# Patient Record
Sex: Male | Born: 2000 | Race: Black or African American | Hispanic: No | Marital: Single | Smoking: Current every day smoker
Health system: Southern US, Community
[De-identification: ages and names within clinical notes are randomized; demographics above are authoritative.]

## PROBLEM LIST (undated history)

## (undated) DIAGNOSIS — F909 Attention-deficit hyperactivity disorder, unspecified type: Secondary | ICD-10-CM

## (undated) DIAGNOSIS — S31119A Laceration without foreign body of abdominal wall, unspecified quadrant without penetration into peritoneal cavity, initial encounter: Secondary | ICD-10-CM

---
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
## 2020-04-16 ENCOUNTER — Ambulatory Visit: Payer: Medicaid Other | Attending: Student

## 2020-04-16 ENCOUNTER — Other Ambulatory Visit: Payer: Self-pay

## 2020-04-16 DIAGNOSIS — M6281 Muscle weakness (generalized): Secondary | ICD-10-CM

## 2020-04-16 DIAGNOSIS — R2689 Other abnormalities of gait and mobility: Secondary | ICD-10-CM

## 2020-04-16 DIAGNOSIS — M25571 Pain in right ankle and joints of right foot: Secondary | ICD-10-CM | POA: Diagnosis not present

## 2020-04-16 DIAGNOSIS — M25671 Stiffness of right ankle, not elsewhere classified: Secondary | ICD-10-CM

## 2020-04-16 NOTE — Patient Instructions (Signed)
Ankle  DF stretch , AROM toes and ankle 4 way   3x/day 3-5 reps 30 sec hold.
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
## 2020-05-07 ENCOUNTER — Ambulatory Visit: Payer: Medicaid Other | Admitting: Physical Therapy

## 2020-05-08 ENCOUNTER — Telehealth: Payer: Self-pay | Admitting: Physical Therapy

## 2020-05-08 ENCOUNTER — Ambulatory Visit: Payer: Medicaid Other | Attending: Student | Admitting: Physical Therapy

## 2020-05-08 NOTE — Telephone Encounter (Signed)
Attempted to call patient and left voicemail regarding no show for 2:15 therapy appointment. Left reminder of next scheduled appointment as well as therapy attendance policy and requested to call clinic back if needing to cancel/reschedule visit.

## 2020-05-12 ENCOUNTER — Telehealth: Payer: Self-pay | Admitting: Physical Therapy

## 2020-05-12 ENCOUNTER — Ambulatory Visit: Payer: Medicaid Other | Admitting: Physical Therapy

## 2020-05-12 NOTE — Telephone Encounter (Signed)
Spoke with patient's mom regarding his missed appointment today. I stated it is his 2nd missed appointment which we can only schedule 1 appointment at a time per clinic policy. I noted when his next scheduled appointment is and if he cannot make it to call before his appointment and we can work to reschedule that for him. If he ends up missing this appointment that would be 3 consecutive missed appointments which would be grounds for discharge. Patient's mom stated she will let him know and he will attend the next session.   Sharetha Newson PT, DPT, LAT, ATC  05/12/20  4:24 PM

## 2020-05-14 ENCOUNTER — Ambulatory Visit: Payer: Medicaid Other | Admitting: Rehabilitative and Restorative Service Providers"

## 2020-05-19 ENCOUNTER — Ambulatory Visit: Payer: Medicaid Other | Admitting: Physical Therapy

## 2020-05-22 ENCOUNTER — Ambulatory Visit: Payer: Medicaid Other | Admitting: Physical Therapy

## 2020-12-12 ENCOUNTER — Other Ambulatory Visit: Payer: Self-pay | Admitting: Orthopaedic Surgery

## 2020-12-12 DIAGNOSIS — M25571 Pain in right ankle and joints of right foot: Secondary | ICD-10-CM

## 2020-12-26 ENCOUNTER — Other Ambulatory Visit: Payer: Self-pay | Admitting: Orthopaedic Surgery

## 2020-12-26 DIAGNOSIS — M25571 Pain in right ankle and joints of right foot: Secondary | ICD-10-CM

## 2020-12-30 ENCOUNTER — Encounter: Payer: Self-pay | Admitting: Neurology

## 2021-01-05 ENCOUNTER — Other Ambulatory Visit: Payer: Medicaid Other

## 2021-01-05 ENCOUNTER — Other Ambulatory Visit: Payer: Self-pay

## 2021-01-05 ENCOUNTER — Ambulatory Visit
Admission: RE | Admit: 2021-01-05 | Discharge: 2021-01-05 | Disposition: A | Discharge status: Home / Self Care | Payer: Medicaid Other | Source: Ambulatory Visit | Attending: Orthopaedic Surgery | Admitting: Orthopaedic Surgery

## 2021-01-05 DIAGNOSIS — M25571 Pain in right ankle and joints of right foot: Secondary | ICD-10-CM

## 2021-01-20 ENCOUNTER — Other Ambulatory Visit: Payer: Self-pay

## 2021-01-20 DIAGNOSIS — R202 Paresthesia of skin: Secondary | ICD-10-CM

## 2021-01-21 ENCOUNTER — Other Ambulatory Visit: Payer: Self-pay

## 2021-01-21 ENCOUNTER — Ambulatory Visit (INDEPENDENT_AMBULATORY_CARE_PROVIDER_SITE_OTHER): Payer: Medicaid Other | Admitting: Neurology

## 2021-01-21 DIAGNOSIS — R202 Paresthesia of skin: Secondary | ICD-10-CM | POA: Diagnosis not present

## 2021-01-21 DIAGNOSIS — R29898 Other symptoms and signs involving the musculoskeletal system: Secondary | ICD-10-CM
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
## 2021-05-27 ENCOUNTER — Encounter (HOSPITAL_COMMUNITY): Payer: Self-pay | Admitting: Emergency Medicine

## 2021-05-27 ENCOUNTER — Emergency Department (HOSPITAL_COMMUNITY): Payer: Medicaid Other

## 2021-05-27 ENCOUNTER — Observation Stay (HOSPITAL_COMMUNITY)
Admission: EM | Admit: 2021-05-27 | Discharge: 2021-05-28 | Disposition: A | Discharge status: Home / Self Care | Payer: Medicaid Other | Attending: General Surgery | Admitting: General Surgery

## 2021-05-27 ENCOUNTER — Other Ambulatory Visit: Payer: Self-pay

## 2021-05-27 DIAGNOSIS — Y92009 Unspecified place in unspecified non-institutional (private) residence as the place of occurrence of the external cause: Secondary | ICD-10-CM | POA: Insufficient documentation

## 2021-05-27 DIAGNOSIS — S31610A Laceration without foreign body of abdominal wall, right upper quadrant with penetration into peritoneal cavity, initial encounter: Principal | ICD-10-CM | POA: Insufficient documentation

## 2021-05-27 DIAGNOSIS — J9690 Respiratory failure, unspecified, unspecified whether with hypoxia or hypercapnia: Secondary | ICD-10-CM

## 2021-05-27 DIAGNOSIS — Z23 Encounter for immunization: Secondary | ICD-10-CM | POA: Insufficient documentation

## 2021-05-27 DIAGNOSIS — S21111A Laceration without foreign body of right front wall of thorax without penetration into thoracic cavity, initial encounter: Secondary | ICD-10-CM | POA: Diagnosis not present

## 2021-05-27 DIAGNOSIS — T1490XA Injury, unspecified, initial encounter: Secondary | ICD-10-CM

## 2021-05-27 DIAGNOSIS — J90 Pleural effusion, not elsewhere classified: Secondary | ICD-10-CM | POA: Insufficient documentation

## 2021-05-27 DIAGNOSIS — Z20822 Contact with and (suspected) exposure to covid-19: Secondary | ICD-10-CM | POA: Insufficient documentation

## 2021-05-27 DIAGNOSIS — T148XXA Other injury of unspecified body region, initial encounter: Secondary | ICD-10-CM | POA: Diagnosis present

## 2021-05-27 LAB — PROTIME-INR
INR: 1.1 (ref 0.8–1.2)
INR: 1.1 (ref 0.8–1.2)
Prothrombin Time: 13.7 s (ref 11.4–15.2)
Prothrombin Time: 14.2 s (ref 11.4–15.2)

## 2021-05-27 LAB — SAMPLE TO BLOOD BANK

## 2021-05-27 LAB — RESP PANEL BY RT-PCR (FLU A&B, COVID) ARPGX2
Influenza A by PCR: NEGATIVE
Influenza B by PCR: NEGATIVE
SARS Coronavirus 2 by RT PCR: NEGATIVE

## 2021-05-27 LAB — URINALYSIS, ROUTINE W REFLEX MICROSCOPIC
Bacteria, UA: NONE SEEN
Bilirubin Urine: NEGATIVE
Glucose, UA: NEGATIVE mg/dL
Hgb urine dipstick: NEGATIVE
Ketones, ur: NEGATIVE mg/dL
Leukocytes,Ua: NEGATIVE
Nitrite: NEGATIVE
Protein, ur: 30 mg/dL — AB
Specific Gravity, Urine: 1.033 — ABNORMAL HIGH (ref 1.005–1.030)
pH: 6 (ref 5.0–8.0)

## 2021-05-27 LAB — I-STAT CHEM 8, ED
BUN: 15 mg/dL (ref 6–20)
Calcium, Ion: 1.09 mmol/L — ABNORMAL LOW (ref 1.15–1.40)
Chloride: 104 mmol/L (ref 98–111)
Creatinine, Ser: 1.2 mg/dL (ref 0.61–1.24)
Glucose, Bld: 99 mg/dL (ref 70–99)
HCT: 48 % (ref 39.0–52.0)
Hemoglobin: 16.3 g/dL (ref 13.0–17.0)
Potassium: 3.9 mmol/L (ref 3.5–5.1)
Sodium: 142 mmol/L (ref 135–145)
TCO2: 27 mmol/L (ref 22–32)

## 2021-05-27 LAB — COMPREHENSIVE METABOLIC PANEL
ALT: 11 U/L (ref 0–44)
AST: 28 U/L (ref 15–41)
Albumin: 4.4 g/dL (ref 3.5–5.0)
Alkaline Phosphatase: 68 U/L (ref 38–126)
Anion gap: 11 (ref 5–15)
BUN: 13 mg/dL (ref 6–20)
CO2: 24 mmol/L (ref 22–32)
Calcium: 9.4 mg/dL (ref 8.9–10.3)
Chloride: 106 mmol/L (ref 98–111)
Creatinine, Ser: 1.29 mg/dL — ABNORMAL HIGH (ref 0.61–1.24)
GFR, Estimated: >60 mL/min (ref >60)
Glucose, Bld: 98 mg/dL (ref 70–99)
Potassium: 4.1 mmol/L (ref 3.5–5.1)
Sodium: 141 mmol/L (ref 135–145)
Total Bilirubin: 1.7 mg/dL — ABNORMAL HIGH (ref 0.3–1.2)
Total Protein: 7 g/dL (ref 6.5–8.1)

## 2021-05-27 LAB — CBC
HCT: 48.2 % (ref 39.0–52.0)
Hemoglobin: 16.8 g/dL (ref 13.0–17.0)
MCH: 29.1 pg (ref 26.0–34.0)
MCHC: 34.9 g/dL (ref 30.0–36.0)
MCV: 83.4 fL (ref 80.0–100.0)
Platelets: 336 10*3/uL (ref 150–400)
RBC: 5.78 MIL/uL (ref 4.22–5.81)
RDW: 12.2 % (ref 11.5–15.5)
WBC: 5.2 10*3/uL (ref 4.0–10.5)
nRBC: 0 % (ref 0.0–0.2)

## 2021-05-27 LAB — ETHANOL: Alcohol, Ethyl (B): <10 mg/dL (ref <10)

## 2021-05-27 LAB — LACTIC ACID, PLASMA: Lactic Acid, Venous: 2.1 mmol/L (ref 0.5–1.9)

## 2021-05-27 LAB — HIV ANTIBODY (ROUTINE TESTING W REFLEX): HIV Screen 4th Generation wRfx: NONREACTIVE

## 2021-05-27 MED ORDER — SODIUM CHLORIDE 0.9 % IV SOLN
INTRAVENOUS | Status: AC | PRN
  Administered 2021-05-27: 1000 mL via INTRAVENOUS

## 2021-05-27 MED ORDER — ONDANSETRON 4 MG PO TBDP
4.0000 mg | ORAL_TABLET | Freq: Four times a day (QID) | ORAL | Status: DC | PRN
  Filled 2021-05-27: qty 1

## 2021-05-27 MED ORDER — CEFAZOLIN SODIUM-DEXTROSE 2-4 GM/100ML-% IV SOLN
2.0000 g | Freq: Once | INTRAVENOUS | Status: AC
  Administered 2021-05-27: 2 g via INTRAVENOUS

## 2021-05-27 MED ORDER — METHOCARBAMOL 500 MG PO TABS
1000.0000 mg | ORAL_TABLET | Freq: Three times a day (TID) | ORAL | Status: DC
  Administered 2021-05-27 – 2021-05-28 (×3): 1000 mg via ORAL
  Filled 2021-05-27 (×3): qty 2

## 2021-05-27 MED ORDER — ONDANSETRON HCL 4 MG/2ML IJ SOLN: 4.0000 mg | Freq: Four times a day (QID) | INTRAMUSCULAR | Status: DC | PRN

## 2021-05-27 MED ORDER — OXYCODONE HCL 5 MG PO TABS
10.0000 mg | ORAL_TABLET | Freq: Once | ORAL | Status: AC
  Administered 2021-05-27: 10 mg via ORAL
  Filled 2021-05-27: qty 2

## 2021-05-27 MED ORDER — IOHEXOL 300 MG/ML  SOLN
100.0000 mL | Freq: Once | INTRAMUSCULAR | Status: AC | PRN
  Administered 2021-05-27: 100 mL via INTRAVENOUS

## 2021-05-27 MED ORDER — ACETAMINOPHEN 500 MG PO TABS
1000.0000 mg | ORAL_TABLET | Freq: Four times a day (QID) | ORAL | Status: DC
  Administered 2021-05-27 – 2021-05-28 (×3): 1000 mg via ORAL
  Filled 2021-05-27 (×3): qty 2

## 2021-05-27 MED ORDER — METHOCARBAMOL 500 MG PO TABS: 1000.0000 mg | ORAL_TABLET | Freq: Three times a day (TID) | ORAL | Status: DC

## 2021-05-27 MED ORDER — LIDOCAINE-EPINEPHRINE (PF) 2 %-1:200000 IJ SOLN
INTRAMUSCULAR | Status: AC
  Filled 2021-05-27: qty 20

## 2021-05-27 MED ORDER — ENOXAPARIN SODIUM 30 MG/0.3ML IJ SOSY
30.0000 mg | PREFILLED_SYRINGE | Freq: Two times a day (BID) | INTRAMUSCULAR | Status: DC
  Filled 2021-05-27: qty 0.3

## 2021-05-27 MED ORDER — TETANUS-DIPHTH-ACELL PERTUSSIS 5-2.5-18.5 LF-MCG/0.5 IM SUSY
0.5000 mL | PREFILLED_SYRINGE | Freq: Once | INTRAMUSCULAR | Status: AC
  Administered 2021-05-27: 0.5 mL via INTRAMUSCULAR

## 2021-05-27 MED ORDER — OXYCODONE HCL 5 MG PO TABS
5.0000 mg | ORAL_TABLET | ORAL | Status: DC | PRN
  Administered 2021-05-27 – 2021-05-28 (×4): 10 mg via ORAL
  Filled 2021-05-27 (×4): qty 2

## 2021-05-27 MED ORDER — FENTANYL CITRATE PF 50 MCG/ML IJ SOSY
PREFILLED_SYRINGE | INTRAMUSCULAR | Status: AC
  Administered 2021-05-27: 50 ug via INTRAVENOUS
  Filled 2021-05-27: qty 1

## 2021-05-27 MED ORDER — DOCUSATE SODIUM 100 MG PO CAPS
100.0000 mg | ORAL_CAPSULE | Freq: Two times a day (BID) | ORAL | Status: DC
  Administered 2021-05-28: 100 mg via ORAL
  Filled 2021-05-27 (×2): qty 1

## 2021-05-27 MED ORDER — LACTATED RINGERS IV SOLN: INTRAVENOUS | Status: AC
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
## 2021-05-27 NOTE — ED Notes (Signed)
Attempted to call report, call back number left.

## 2021-05-27 NOTE — Discharge Planning (Signed)
TOC following for disposition needs. 
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
## 2021-05-27 NOTE — Progress Notes (Signed)
Patient arrived to 6 north room 13 alert and oriented x4. Pain level 5. Call light in reach bed in lowest position. Will continue to monitor pt.
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
## 2021-05-28 ENCOUNTER — Observation Stay (HOSPITAL_COMMUNITY): Payer: Medicaid Other

## 2021-05-28 LAB — BASIC METABOLIC PANEL
Anion gap: 10 (ref 5–15)
BUN: 12 mg/dL (ref 6–20)
CO2: 25 mmol/L (ref 22–32)
Calcium: 8.8 mg/dL — ABNORMAL LOW (ref 8.9–10.3)
Chloride: 103 mmol/L (ref 98–111)
Creatinine, Ser: 1.23 mg/dL (ref 0.61–1.24)
GFR, Estimated: >60 mL/min (ref >60)
Glucose, Bld: 132 mg/dL — ABNORMAL HIGH (ref 70–99)
Potassium: 3.7 mmol/L (ref 3.5–5.1)
Sodium: 138 mmol/L (ref 135–145)

## 2021-05-28 LAB — CBC
HCT: 39.1 % (ref 39.0–52.0)
Hemoglobin: 13.7 g/dL (ref 13.0–17.0)
MCH: 28.8 pg (ref 26.0–34.0)
MCHC: 35 g/dL (ref 30.0–36.0)
MCV: 82.1 fL (ref 80.0–100.0)
Platelets: 288 10*3/uL (ref 150–400)
RBC: 4.76 MIL/uL (ref 4.22–5.81)
RDW: 12.2 % (ref 11.5–15.5)
WBC: 11.5 10*3/uL — ABNORMAL HIGH (ref 4.0–10.5)
nRBC: 0 % (ref 0.0–0.2)

## 2021-05-28 MED ORDER — ACETAMINOPHEN 500 MG PO TABS: 1000.0000 mg | ORAL_TABLET | Freq: Four times a day (QID) | ORAL | 0 refills | Status: AC | PRN

## 2021-05-28 MED ORDER — IBUPROFEN 200 MG PO TABS: 600.0000 mg | ORAL_TABLET | Freq: Three times a day (TID) | ORAL | 2 refills | Status: DC | PRN

## 2021-05-28 MED ORDER — OXYCODONE HCL 5 MG PO TABS: 5.0000 mg | ORAL_TABLET | Freq: Four times a day (QID) | ORAL | 0 refills | Status: DC | PRN
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
## 2021-05-28 NOTE — TOC CAGE-AID Note (Signed)
Transition of Care [facility]) - CAGE-AID Screening   Patient Details  Name: Sean Watson MRN: [id number] Date of Birth: 2000-07-30  Transition of Care [facility]) CM/SW Contact:    Dwon Sky C Tarpley-Carter, LCSWA Phone Number: 05/28/2021, 9:47 AM   Clinical Narrative: Pt participated in Cage-Aid.  Pt stated he does not use substance or ETOH.  Pt was not offered resources, due to no usage of substance or ETOH.    Cylinda Santoli Tarpley-Carter, MSW, LCSW-A Pronouns:  She/Her/Hers [place] Transitions of Care Clinical Social Worker Direct Number:  [phone] [email or web address]  CAGE-AID Screening:    Have You Ever Felt You Ought to Cut Down on Your Drinking or Drug Use?: No Have People Annoyed You By [employer] Your Drinking Or Drug Use?: No Have You Felt Bad Or Guilty About Your Drinking Or Drug Use?: No Have You Ever Had a Drink or Used Drugs First Thing In The Morning to Steady Your Nerves or to Get Rid of a Hangover?: No CAGE-AID Score: 0  Substance Abuse Education Offered: No

## 2021-05-28 NOTE — Progress Notes (Signed)
°  Transition of Care [facility]) Screening Note   Patient Details  Name: Sean Watson Date of Birth: [birth year]   Transition of Care [facility]) CM/SW Contact:    Glennon Mac, RN Phone Number: 05/28/2021, 9:06 AM    Transition of Care Department [facility]) has reviewed patient and no TOC needs have been identified at this time. We will continue to monitor patient advancement through interdisciplinary progression rounds. If new patient transition needs arise, please place a TOC consult.  Quintella Baton, RN, BSN  Trauma/Neuro ICU Case Manager [phone]

## 2021-05-28 NOTE — Discharge Instructions (Signed)
No physical activity for 4 weeks.  Please return or call our office if you have worsening abdominal pain, Nausea, vomiting, feel light headedness, dizzy, or concerned you are having worsening abdominal symptoms.

## 2021-06-01 ENCOUNTER — Encounter (HOSPITAL_BASED_OUTPATIENT_CLINIC_OR_DEPARTMENT_OTHER): Payer: Self-pay | Admitting: *Deleted

## 2021-06-01 ENCOUNTER — Emergency Department (HOSPITAL_BASED_OUTPATIENT_CLINIC_OR_DEPARTMENT_OTHER): Payer: Medicaid Other

## 2021-06-01 ENCOUNTER — Emergency Department (HOSPITAL_BASED_OUTPATIENT_CLINIC_OR_DEPARTMENT_OTHER)
Admission: EM | Admit: 2021-06-01 | Discharge: 2021-06-01 | Disposition: A | Discharge status: Home / Self Care | Payer: Medicaid Other | Attending: Emergency Medicine | Admitting: Emergency Medicine

## 2021-06-01 ENCOUNTER — Other Ambulatory Visit: Payer: Self-pay

## 2021-06-01 DIAGNOSIS — X58XXXA Exposure to other specified factors, initial encounter: Secondary | ICD-10-CM | POA: Diagnosis not present

## 2021-06-01 DIAGNOSIS — S36112A Contusion of liver, initial encounter: Secondary | ICD-10-CM | POA: Insufficient documentation

## 2021-06-01 DIAGNOSIS — R0602 Shortness of breath: Secondary | ICD-10-CM | POA: Insufficient documentation

## 2021-06-01 DIAGNOSIS — R0600 Dyspnea, unspecified: Secondary | ICD-10-CM | POA: Diagnosis not present

## 2021-06-01 DIAGNOSIS — S3991XA Unspecified injury of abdomen, initial encounter: Secondary | ICD-10-CM | POA: Diagnosis present

## 2021-06-01 DIAGNOSIS — S31110A Laceration without foreign body of abdominal wall, right upper quadrant without penetration into peritoneal cavity, initial encounter: Secondary | ICD-10-CM | POA: Diagnosis not present

## 2021-06-01 DIAGNOSIS — R1011 Right upper quadrant pain: Secondary | ICD-10-CM

## 2021-06-01 DIAGNOSIS — S31119A Laceration without foreign body of abdominal wall, unspecified quadrant without penetration into peritoneal cavity, initial encounter: Secondary | ICD-10-CM

## 2021-06-01 HISTORY — DX: Laceration without foreign body of abdominal wall, unspecified quadrant without penetration into peritoneal cavity, initial encounter: S31.119A

## 2021-06-01 LAB — CBC WITH DIFFERENTIAL/PLATELET
Abs Immature Granulocytes: 0.02 10*3/uL (ref 0.00–0.07)
Basophils Absolute: 0.1 10*3/uL (ref 0.0–0.1)
Basophils Relative: 1 %
Eosinophils Absolute: 0.3 10*3/uL (ref 0.0–0.5)
Eosinophils Relative: 5 %
HCT: 38.9 % — ABNORMAL LOW (ref 39.0–52.0)
Hemoglobin: 13.2 g/dL (ref 13.0–17.0)
Immature Granulocytes: 0 %
Lymphocytes Relative: 43 %
Lymphs Abs: 2.5 10*3/uL (ref 0.7–4.0)
MCH: 28 pg (ref 26.0–34.0)
MCHC: 33.9 g/dL (ref 30.0–36.0)
MCV: 82.4 fL (ref 80.0–100.0)
Monocytes Absolute: 0.4 10*3/uL (ref 0.1–1.0)
Monocytes Relative: 7 %
Neutro Abs: 2.6 10*3/uL (ref 1.7–7.7)
Neutrophils Relative %: 44 %
Platelets: 308 10*3/uL (ref 150–400)
RBC: 4.72 MIL/uL (ref 4.22–5.81)
RDW: 12.3 % (ref 11.5–15.5)
WBC: 5.9 10*3/uL (ref 4.0–10.5)
nRBC: 0 % (ref 0.0–0.2)

## 2021-06-01 LAB — COMPREHENSIVE METABOLIC PANEL
ALT: 5 U/L (ref 0–44)
AST: 13 U/L — ABNORMAL LOW (ref 15–41)
Albumin: 4.2 g/dL (ref 3.5–5.0)
Alkaline Phosphatase: 69 U/L (ref 38–126)
Anion gap: 9 (ref 5–15)
BUN: 13 mg/dL (ref 6–20)
CO2: 28 mmol/L (ref 22–32)
Calcium: 8.9 mg/dL (ref 8.9–10.3)
Chloride: 104 mmol/L (ref 98–111)
Creatinine, Ser: 1.08 mg/dL (ref 0.61–1.24)
GFR, Estimated: >60 mL/min (ref >60)
Glucose, Bld: 97 mg/dL (ref 70–99)
Potassium: 3.7 mmol/L (ref 3.5–5.1)
Sodium: 141 mmol/L (ref 135–145)
Total Bilirubin: 0.5 mg/dL (ref 0.3–1.2)
Total Protein: 6.5 g/dL (ref 6.5–8.1)

## 2021-06-01 MED ORDER — OXYCODONE HCL 5 MG PO TABS: 5.0000 mg | ORAL_TABLET | Freq: Four times a day (QID) | ORAL | 0 refills | Status: AC | PRN

## 2021-06-01 MED ORDER — OXYCODONE-ACETAMINOPHEN 5-325 MG PO TABS
1.0000 | ORAL_TABLET | Freq: Once | ORAL | Status: AC
  Administered 2021-06-01: 1 via ORAL
  Filled 2021-06-01: qty 1
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
## 2021-06-01 NOTE — ED Notes (Signed)
Patient verbalizes understanding of discharge instructions. Opportunity for questioning and answers were provided. Patient discharged from ED.  °

## 2021-06-01 NOTE — Discharge Instructions (Addendum)
It was a pleasure caring for you today! You may take Tylenol (acetaminophen) 1000 mg 4 times a day for 1 week. This is the maximum dose of Tylenol usually take from all sources. Please check other over-the-counter medications and prescriptions to ensure you are not taking other medications that contain acetaminophen.  You may also take ibuprofen 400 mg 6 times a day alternating with or at the same time as tylenol (with food).  Take oxycodone as needed for breakthrough pain.  This medication can be addicting, sedating and cause constipation.  Use the incentive spirometer to make sure you are taking deep breaths, even though it will be uncomfortable.    Return to the ED if you develop worsening shortness of breath, pain, fever, lightheadedness or other concerns.
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
